# Patient Record
Sex: Male | Born: 2000 | Hispanic: No | Marital: Single | Smoking: Never smoker
Health system: Southern US, Community
[De-identification: ages and names within clinical notes are randomized; demographics above are authoritative.]

## PROBLEM LIST (undated history)

## (undated) HISTORY — PX: APPENDECTOMY: SHX54

---
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
## 2022-09-15 ENCOUNTER — Emergency Department (HOSPITAL_COMMUNITY)
Admission: EM | Admit: 2022-09-15 | Discharge: 2022-09-15 | Disposition: A | Discharge status: Home / Self Care | Payer: Medicaid Other | Attending: Emergency Medicine | Admitting: Emergency Medicine

## 2022-09-15 ENCOUNTER — Other Ambulatory Visit: Payer: Self-pay

## 2022-09-15 ENCOUNTER — Emergency Department (HOSPITAL_COMMUNITY): Payer: Medicaid Other

## 2022-09-15 DIAGNOSIS — Y9351 Activity, roller skating (inline) and skateboarding: Secondary | ICD-10-CM | POA: Insufficient documentation

## 2022-09-15 DIAGNOSIS — S59911A Unspecified injury of right forearm, initial encounter: Secondary | ICD-10-CM | POA: Diagnosis present

## 2022-09-15 DIAGNOSIS — S62011A Displaced fracture of distal pole of navicular [scaphoid] bone of right wrist, initial encounter for closed fracture: Secondary | ICD-10-CM | POA: Diagnosis not present

## 2022-09-15 DIAGNOSIS — S62001A Unspecified fracture of navicular [scaphoid] bone of right wrist, initial encounter for closed fracture: Secondary | ICD-10-CM | POA: Diagnosis not present

## 2022-09-15 DIAGNOSIS — M25531 Pain in right wrist: Secondary | ICD-10-CM | POA: Diagnosis not present

## 2022-09-15 MED ORDER — IBUPROFEN 400 MG PO TABS
400.0000 mg | ORAL_TABLET | Freq: Once | ORAL | Status: AC
  Administered 2022-09-15: 400 mg via ORAL
  Filled 2022-09-15: qty 1

## 2022-09-15 NOTE — ED Provider Notes (Signed)
[place] EMERGENCY DEPARTMENT AT [facility] Provider Note   CSN: [id number] Arrival date & time: 09/15/22  4098     History  Chief Complaint  Patient presents with   Wrist Pain    Eric Burgess is a 22 y.o. male.  The history is provided by the patient.   Patient presents after skateboard accident injuring his right wrist.  This occurred around 8:30 PM yesterday.  He reports he slipped off a skateboard and fell on his right arm and wrist.  He did not land on an outstretched hand.  He reports most of the pain is in his right wrist.  No head injury or LOC.  No other acute complaints    Home Medications Prior to Admission medications   Not on File      Allergies    Patient has no known allergies.    Review of Systems   Review of Systems  Musculoskeletal:  Positive for arthralgias.    Physical Exam Updated Vital Signs BP (!) 134/94 (BP Location: Left Arm)   Pulse 96   Temp 98.2 F (36.8 C) (Oral)   Resp 18   Ht 1.702 m (5\' 7" )   Wt 72.6 kg   SpO2 98%   BMI 25.06 kg/m  Physical Exam CONSTITUTIONAL: Well developed/well nourished HEAD: Normocephalic/atraumatic SPINE/BACK:entire spine nontender NEURO: Pt is awake/alert/appropriate, moves all extremitiesx4.  No facial droop.   EXTREMITIES: pulses normal/equal, full ROM Tenderness and swelling noted to the right wrist.  No snuffbox tenderness.  No tenderness to the right hand or fingers. All other extremities/joints palpated/ranged and nontender SKIN: warm, color normal PSYCH: no abnormalities of mood noted, alert and oriented to situation  ED Results / Procedures / Treatments   Labs (all labs ordered are listed, but only abnormal results are displayed) Labs Reviewed - No data to display  EKG None  Radiology DG Wrist Complete Right  Result Date: 09/15/2022 CLINICAL DATA:  Encounter for right wrist pain. Patient suffered a right wrist fracture at age 64 in 76 with epiphyseal impaction  and dorsal translation and a nondisplaced ulnar styloid fracture. There are no intervening studies. EXAM: RIGHT WRIST - COMPLETE 3+ VIEW COMPARISON:  Right wrist series 03/10/2014 FINDINGS: There is a transverse scaphoid waist fracture with slight radial translation of the distal fragment. Difficult to say if this is acute as the second and fourth images suggest a degree of sclerosis along the fracture margins. There is a small comminution fragment at the radial fracture margin which is also age indeterminate. There is mild soft tissue swelling at the wrist without other focal or acute abnormality. No further fractures. Arthritic changes are not seen. IMPRESSION: Transverse scaphoid waist fracture with slight radial translation of the distal fragment. Difficult to say if this is acute as the second and fourth images suggest a degree of sclerosis along the fracture margins. Small comminution fragment at the radial fracture margin which is age indeterminate. Soft tissue swelling. Electronically Signed   By: Almira Bar M.D.   On: 09/15/2022 05:40    Procedures .Ortho Injury Treatment  Date/Time: 09/15/2022 6:00 AM  Performed by: Zadie Rhine, MD Authorized by: Zadie Rhine, MD   Consent:    Consent obtained:  Verbal   Consent given by:  Patient   Alternatives discussed:  No treatmentInjury location: wrist Location details: right wrist Injury type: fracture Fracture type: scaphoid Pre-procedure neurovascular assessment: neurovascularly intact Pre-procedure distal perfusion: normal Pre-procedure neurological function: normal Pre-procedure range of motion: reduced Manipulation  performed: no Immobilization: splint Splint type: thumb spica Splint Applied by: Milon Dikes Supplies used: Ortho-Glass Post-procedure neurovascular assessment: post-procedure neurovascularly intact Post-procedure distal perfusion: normal Post-procedure neurological function: normal Post-procedure range of  motion: unchanged       Medications Ordered in ED Medications  ibuprofen (ADVIL) tablet 400 mg (400 mg Oral Given 09/15/22 0431)    ED Course/ Medical Decision Making/ A&P Clinical Course as of 09/15/22 0616  Thu Sep 15, 2022  0616 Patient found to have scaphoid injury, though he likely has had injured this before.  Patient reports frequent falls while skateboarding.  Patient was placed in a splint and referred to hand surgery.  Discussed at length splint care as well as need to stop skateboarding [DW]    Clinical Course User Index [DW] Zadie Rhine, MD                             Medical Decision Making Amount and/or Complexity of Data Reviewed Radiology: ordered.  Risk Prescription drug management.           Final Clinical Impression(s) / ED Diagnoses Final diagnoses:  Closed displaced fracture of distal pole of scaphoid bone of right wrist, initial encounter    Rx / DC Orders ED Discharge Orders     None         Zadie Rhine, MD 09/15/22 [phone]

## 2022-09-15 NOTE — ED Triage Notes (Signed)
Patient coming to ED for evaluation of R wrist pain.  Reports he fell off his skateboard yesterday and fell onto wrist.  No reports of other injuries.  Did not hit his head.  No LOC

## 2022-09-15 NOTE — ED Notes (Signed)
Ice applied to wrist.

## 2022-09-15 NOTE — Progress Notes (Signed)
Orthopedic Tech Progress Note Patient Details:  Eric Burgess 07/08/2000 [id number]  Ortho Devices Type of Ortho Device: Thumb spica splint Splint Material: Fiberglass Ortho Device/Splint Location: RUE Ortho Device/Splint Interventions: Ordered, Application, Adjustment   Post Interventions Patient Tolerated: Well Instructions Provided: Care of device  [place] A Gerilyn Pilgrim 09/15/2022, 6:16 AM

## 2024-02-01 ENCOUNTER — Other Ambulatory Visit: Payer: Self-pay

## 2024-02-01 ENCOUNTER — Encounter (HOSPITAL_COMMUNITY): Payer: Self-pay

## 2024-02-01 ENCOUNTER — Emergency Department (HOSPITAL_COMMUNITY)

## 2024-02-01 ENCOUNTER — Emergency Department (HOSPITAL_COMMUNITY)
Admission: EM | Admit: 2024-02-01 | Discharge: 2024-02-01 | Disposition: A | Discharge status: Home / Self Care | Attending: Emergency Medicine | Admitting: Emergency Medicine

## 2024-02-01 DIAGNOSIS — X58XXXA Exposure to other specified factors, initial encounter: Secondary | ICD-10-CM | POA: Diagnosis not present

## 2024-02-01 DIAGNOSIS — Y9367 Activity, basketball: Secondary | ICD-10-CM | POA: Diagnosis not present

## 2024-02-01 DIAGNOSIS — S93402A Sprain of unspecified ligament of left ankle, initial encounter: Secondary | ICD-10-CM | POA: Insufficient documentation

## 2024-02-01 DIAGNOSIS — M7989 Other specified soft tissue disorders: Secondary | ICD-10-CM | POA: Diagnosis not present

## 2024-02-01 DIAGNOSIS — M25572 Pain in left ankle and joints of left foot: Secondary | ICD-10-CM | POA: Diagnosis not present

## 2024-02-01 DIAGNOSIS — S99912A Unspecified injury of left ankle, initial encounter: Secondary | ICD-10-CM | POA: Diagnosis present

## 2024-02-01 NOTE — ED Triage Notes (Signed)
 Pt was playing basketball today and thinks he sprained left ankle. Ankle is swollen. Pt ambulatory.

## 2024-02-01 NOTE — ED Notes (Signed)
 Pt verbalized understanding of discharge instructions. Pt ambulatory at time of discharge.

## 2024-02-01 NOTE — ED Provider Notes (Signed)
 [place] EMERGENCY DEPARTMENT AT [facility] Provider Note   CSN: [id number] Arrival date & time: 02/01/24  1457     Patient presents with: Ankle Pain   Eric Burgess is a 23 y.o. male.    Ankle Pain    Patient present to the ED with complaints of left ankle pain.  Patient states he was playing basketball when he thinks he sprained his ankle.  He is able to walk but it is tender and swollen on the outside of his left ankle  Prior to Admission medications   Not on File    Allergies: Patient has no known allergies.    Review of Systems  Updated Vital Signs BP (!) 123/58 (BP Location: Right Arm)   Pulse (!) 59   Temp 98.6 F (37 C) (Oral)   Resp 15   Ht 1.753 m (5' 9)   Wt 79.4 kg   SpO2 100%   BMI 25.84 kg/m   Physical Exam Vitals and nursing note reviewed.  Constitutional:      General: He is not in acute distress.    Appearance: He is well-developed.  HENT:     Head: Normocephalic and atraumatic.     Right Ear: External ear normal.     Left Ear: External ear normal.  Eyes:     General: No scleral icterus.       Right eye: No discharge.        Left eye: No discharge.     Conjunctiva/sclera: Conjunctivae normal.  Neck:     Trachea: No tracheal deviation.  Cardiovascular:     Rate and Rhythm: Normal rate.  Pulmonary:     Effort: Pulmonary effort is normal. No respiratory distress.     Breath sounds: No stridor.  Abdominal:     General: There is no distension.  Musculoskeletal:        General: Swelling and tenderness present. No deformity.     Cervical back: Neck supple.     Comments: Tenderness palpation lateral malleolus, no tenderness palpation of the foot or proximal fibula  Skin:    General: Skin is warm and dry.     Findings: No rash.  Neurological:     Mental Status: He is alert. Mental status is at baseline.     Cranial Nerves: No dysarthria or facial asymmetry.     Motor: No seizure activity.     (all labs ordered  are listed, but only abnormal results are displayed) Labs Reviewed - No data to display  EKG: None  Radiology: DG Ankle Complete Left Result Date: 02/01/2024 EXAM: 3 OR MORE VIEW(S) XRAY OF THE LEFT ANKLE 02/01/2024 03:28:00 PM CLINICAL HISTORY: Ankle pain. Twisted ankle playing basketball; Left ankle pain. COMPARISON: 08/07/2019 FINDINGS: BONES AND JOINTS: No acute fracture. Interval development of probable small spur arising from medial malleolus. No joint dislocation. SOFT TISSUES: Mild lateral soft tissue swelling is noted. IMPRESSION: 1. No acute osseous abnormality. 2. Mild lateral soft tissue swelling. 3. Small spur arising from the medial malleolus, likely degenerative. Electronically signed by: Lynwood Seip MD 02/01/2024 03:55 PM EDT RP Workstation: [id number]     Procedures   Medications Ordered in the ED - No data to display  Clinical Course as of 02/01/24 1827  Thu Feb 01, 2024  1820 X-ray does not show any signs of fracture soft tissue swelling noted [JK]    Clinical Course User Index [JK] Randol Simmonds, MD  Medical Decision Making Problems Addressed: Sprain of left ankle, unspecified ligament, initial encounter: acute illness or injury  Amount and/or Complexity of Data Reviewed Radiology: ordered and independent interpretation performed.   Findings consistent with ankle sprain.  No signs of fracture.  Will place in a splint     Final diagnoses:  Sprain of left ankle, unspecified ligament, initial encounter    ED Discharge Orders     None          Randol Simmonds, MD 02/01/24 [phone]

## 2024-02-01 NOTE — Discharge Instructions (Signed)
 Take over-the-counter medication such as ibuprofen  or Naprosyn for pain associated with your ankle sprain.
